# Patient Record
Sex: Male | Born: 1975 | Race: White | Hispanic: No | Marital: Married | Smoking: Current every day smoker
Health system: Southern US, Community
[De-identification: ages and names within clinical notes are randomized; demographics above are authoritative.]

---

## 2015-12-25 ENCOUNTER — Encounter (HOSPITAL_COMMUNITY): Payer: Self-pay | Admitting: *Deleted

## 2015-12-25 ENCOUNTER — Emergency Department (HOSPITAL_COMMUNITY)
Admission: EM | Admit: 2015-12-25 | Discharge: 2015-12-25 | Disposition: A | Discharge status: Home / Self Care | Payer: Self-pay | Attending: Emergency Medicine | Admitting: Emergency Medicine

## 2015-12-25 DIAGNOSIS — M79605 Pain in left leg: Secondary | ICD-10-CM | POA: Insufficient documentation

## 2015-12-25 DIAGNOSIS — M5442 Lumbago with sciatica, left side: Secondary | ICD-10-CM

## 2015-12-25 DIAGNOSIS — F17200 Nicotine dependence, unspecified, uncomplicated: Secondary | ICD-10-CM | POA: Insufficient documentation

## 2015-12-25 DIAGNOSIS — M5440 Lumbago with sciatica, unspecified side: Secondary | ICD-10-CM | POA: Insufficient documentation

## 2015-12-25 DIAGNOSIS — M5441 Lumbago with sciatica, right side: Secondary | ICD-10-CM

## 2015-12-25 DIAGNOSIS — M79604 Pain in right leg: Secondary | ICD-10-CM | POA: Insufficient documentation

## 2015-12-25 MED ORDER — KETOROLAC TROMETHAMINE 60 MG/2ML IM SOLN
60.0000 mg | Freq: Once | INTRAMUSCULAR | Status: AC
  Administered 2015-12-25: 60 mg via INTRAMUSCULAR
  Filled 2015-12-25: qty 2

## 2015-12-25 MED ORDER — DEXAMETHASONE SODIUM PHOSPHATE 10 MG/ML IJ SOLN
10.0000 mg | Freq: Once | INTRAMUSCULAR | Status: AC
  Administered 2015-12-25: 10 mg via INTRAMUSCULAR
  Filled 2015-12-25: qty 1

## 2015-12-25 MED ORDER — MELOXICAM 15 MG PO TABS: 15.0000 mg | ORAL_TABLET | Freq: Every day | ORAL | Status: AC

## 2015-12-25 NOTE — ED Notes (Signed)
Pt states he hurt himself a few days ago and "really hurt" his back while he was mowing. Pt states his pain is from his lower back bilaterally around to his front on either side. Pt is in NAD upon triage and is wearing sunglasses.

## 2015-12-25 NOTE — ED Notes (Signed)
Pt is in stable condition upon d/c and ambulates from ED. 

## 2015-12-25 NOTE — Discharge Instructions (Signed)
You have been seen today for back pain. Follow-up with orthopedics should symptoms continue. Call the number provided to set up an appointment. Follow up with PCP as needed. Return to ED should symptoms worsen.  RESOURCE GUIDE  Chronic Pain Problems: Contact [facility]  [phone] Patients need to be referred by their primary care doctor.  Insufficient Money for Medicine: Contact [employer] Way:  call "211" or [facility] [phone].  No Primary Care Doctor: - Call Health Connect  [phone] - can help you locate a primary care doctor that  accepts your insurance, provides certain services, etc. - Physician Referral Service- [phone]  Agencies that provide inexpensive medical care: - [facility]  [phone] - [facility]  [phone] - [facility]  [phone] - Women's Clinic  [phone] - Planned Parenthood  [phone] [facility]  [phone]  Medicaid-accepting [facility] Providers: - [facility]- [address], [address]  [phone], Mon-Fri 9am-7pm, Sat 9am-1pm - [facility]- [address], [address] [place]  [phone] - [facility]- [address], [address] [place]  [phone] [facility]- [address] [place] [address]  [phone] - Renaye RakersVeita Bland- [address], [address], [phone]  Only accepts [place] patients after they have their name  applied to their card  Self Pay (no insurance) in [place]: - Sickle Cell Patients: Dr Willey BladeEric Dean, [facility]  [address], [phone] - [facility]- [address]  [phone]       Redge Gainer-     [facility]- [address], [address]       -     [facility]- see information above (Speak to Cit[employer]Pam H if you do not have insurance)       -  [facility]- [address], [phone]       -  [facility]  [facility]- [address],  [phone]       -  [facility]- [address] [place] [address], [phone]       -  Dr Julio Sickssei-Bonsu-  [address], [address], [place], [phone]       -  [facility]- [address], [phone]       -  [facility] - [address] [place] [address], [phone], also [address], [phone]       -    [facility]- [address], [phone], 1st & 3rd Saturday   every month, 10am-1pm  1) Find a Doctor and Pay Out of Pocket Although you won't have to find out who is covered by your insurance plan, it is a good idea to ask around and get recommendations. You will then need to call the office and see if the doctor you have chosen will accept you as a new patient and what types of options they offer for patients who are self-pay. Some doctors offer discounts or will set up payment plans for their patients who do not have insurance, but you will need to ask so you aren't surprised when you get to your appointment.  2) Contact Your Local Health Department Not all health departments have doctors that can see patients for sick visits, but many do, so it is worth a call to see if yours does. If  you don't know where your local health department is, you can check in your phone book. The CDC also has a tool to help you locate your state's health department, and many state websites also have listings of all of their local health departments.  3) Find a [facility] If your illness is not likely to be very severe or complicated, you may want to try a walk in clinic. These are popping up all over the country in pharmacies, drugstores, and shopping centers. They're usually staffed by nurse practitioners or physician assistants that have been trained to treat common illnesses and complaints. They're usually fairly quick and inexpensive. However, if you have serious medical issues or chronic medical problems, these are probably not your best option  STD  Testing - [place], [facility], [address] [place], phone [phone] or [phone].  Monday - Friday, call for an appointment. - [place], [facility], [address], [place], phone [phone] or [phone].  Monday - Friday, call for an appointment.  Abuse/Neglect: - [place] [phone] - [place] [phone] (After Hours)  Emergency Shelter:  [facility] [phone]  Maternity Homes: - Room at the [place] [phone] - [place] [phone]  MRSA Hotline #:   2696065598  [facility]. [address]         [place] Phone:  [phone]                                  Phone:  [phone]                   Phone:  [phone]  [place], [place] in [place], [address]                                  [place] [phone] or [phone] (After Hours)   [place]  Substance Abuse Resources: - [facility] and Drug Services  [phone] - [place] [phone] - [facility] [phone] - Residential & Outpatient Substance Abuse Program  [phone]  Psychological Services: - [place]  [address]  [place], [phone] [place]. [address] [place], [place]: [phone] or [phone],  Pic[employer].uy  Dental Assistance  If unable to pay or uninsured, contact:  [facility] or [facility]. to become qualified for the adult dental clinic.  Patients with Medicaid: [facility] [phone] [address], [place] [address] [phone]  If unable to pay, or uninsured, contact [facility] [phone]) or [place] [phone] in [place], [place] in [facility]) to become qualified for the adult dental clinic   Other [place]- [place], [place], [place], [address], [place], [place], 2nd and 4th Thursday of the month at 6:30am.  10 clients each day by appointment, can sometimes see walk-in patients if someone does not show for an appointment. [facility]- [address] Hillard D[place]er [place], [place], [address], [place], [place], [place], [address], [place] Department- [place] Department- [place] Department- [phone]

## 2015-12-25 NOTE — ED Provider Notes (Signed)
CSN: [id number]     Arrival date & time 12/25/15  1248 History  By signing my name below, I, Linna Darner, attest that this documentation has been prepared under the direction and in the presence of non-physician practitioner, Harolyn Rutherford, PA-C. Electronically Signed: Linna Darner, Scribe. 12/25/2015. 1:03 PM.    Chief Complaint  Patient presents with  . Back Pain    The history is provided by the patient. No language interpreter was used.     HPI Comments: Andrew Jimenez is a 40 y.o. male with no pertinent PMHx who presents to the Emergency Department complaining of sudden onset, constant, severe, bilateral lower back pain for the last week. Pt notes that he routinely and frequently lifts and moves heavy objects at work and believes this is the cause of his lower back pain. Patient adds that his lifting and moving has been much more than usual during the past 2 weeks. Patient states that his current pain is consistent with previous pain he has experienced. He notes that his lower back pain radiates down his bilateral legs. Pt reports that any movement, twisting, or turning exacerbates his lower back pain severely. He endorses difficulty sleeping last night due to pain. Pt has been applying Salon-Pas (lidocaine) patches and using Advil with no relief; he is in a drug recovery program so he does not use narcotics for pain. He has no h/o cancer, HIV, or IV drug use. Pt has no known allergies. He further denies recent falls/trauma, fever, dysuria, bowel/bladder dysfunction, or any other associated symptoms. He has never seen an orthopedic surgeon for his issues.  History reviewed. No pertinent past medical history. History reviewed. No pertinent past surgical history. History reviewed. No pertinent family history. Social History  Substance Use Topics  . Smoking status: Current Every Day Smoker  . Smokeless tobacco: None  . Alcohol Use: Yes    Review of Systems  Constitutional: Negative for  fever and chills.  Gastrointestinal: Negative for nausea, vomiting and abdominal pain.  Genitourinary: Negative for dysuria and difficulty urinating.  Musculoskeletal: Positive for back pain (bilateral lower) and arthralgias (bilateral legs).  Skin: Negative for color change and pallor.  Neurological: Negative for weakness and numbness.   Allergies  Review of patient's allergies indicates no known allergies.  Home Medications   Prior to Admission medications   Medication Sig Jimenez Date End Date Taking? Authorizing Provider  meloxicam (MOBIC) 15 MG tablet Take 1 tablet (15 mg total) by mouth daily. 12/25/15   Ukiah Trawick C Tashya Alberty, PA-C   BP 113/84 mmHg  Pulse 95  Temp(Src) 98.2 F (36.8 C) (Oral)  Resp 20  SpO2 96% Physical Exam  Constitutional: He is oriented to person, place, and time. He appears well-developed and well-nourished. No distress.  HENT:  Head: Normocephalic and atraumatic.  Eyes: Conjunctivae are normal.  Cardiovascular: Normal rate and regular rhythm.   Pulmonary/Chest: Effort normal.  Musculoskeletal:  Bilateral lumbar muscular tenderness. Full ROM in all extremities and spine. No paraspinal tenderness.   Neurological: He is alert and oriented to person, place, and time. He has normal reflexes.  No sensory deficits. Strength 5/5 in all extremities. No gait disturbance. Coordination intact.   Skin: Skin is warm and dry. He is not diaphoretic.  Nursing note and vitals reviewed.   ED Course  Procedures (including critical care time)  DIAGNOSTIC STUDIES: Oxygen Saturation is 96% on RA, adequate by my interpretation.    COORDINATION OF CARE: 1:04 PM Discussed treatment plan with pt at bedside  and pt agreed to plan.    MDM   Final diagnoses:  Bilateral low back pain with sciatica, sciatica laterality unspecified    Andrew Jimenez presents with lower back pain that occurred after a particularly difficult week at work lifting and moving things.  Patient's  presentation is consistent with muscle strain. No neuro or functional deficits. No red flag symptoms. Patient to follow up with the PCP or orthopedics should symptoms continue. Home care and return precautions discussed. Patient voiced understanding of these instructions and is comfortable with discharge.  I personally performed the services described in this documentation, which was scribed in my presence. The recorded information has been reviewed and is accurate.     Anselm PancoastShawn C Hollin Crewe, PA-C 12/26/15 0753  Loren Raceravid Yelverton, MD 12/29/15 [id number]

## 2016-01-20 ENCOUNTER — Emergency Department (HOSPITAL_COMMUNITY)
Admission: EM | Admit: 2016-01-20 | Discharge: 2016-01-20 | Disposition: A | Discharge status: Home / Self Care | Payer: Worker's Compensation | Attending: Emergency Medicine | Admitting: Emergency Medicine

## 2016-01-20 ENCOUNTER — Encounter (HOSPITAL_COMMUNITY): Payer: Self-pay | Admitting: Emergency Medicine

## 2016-01-20 ENCOUNTER — Emergency Department (HOSPITAL_COMMUNITY): Payer: Worker's Compensation

## 2016-01-20 DIAGNOSIS — Z791 Long term (current) use of non-steroidal anti-inflammatories (NSAID): Secondary | ICD-10-CM | POA: Insufficient documentation

## 2016-01-20 DIAGNOSIS — F17200 Nicotine dependence, unspecified, uncomplicated: Secondary | ICD-10-CM | POA: Insufficient documentation

## 2016-01-20 DIAGNOSIS — M545 Low back pain: Secondary | ICD-10-CM | POA: Insufficient documentation

## 2016-01-20 MED ORDER — HYDROCODONE-ACETAMINOPHEN 5-325 MG PO TABS
1.0000 | ORAL_TABLET | Freq: Once | ORAL | Status: AC
  Administered 2016-01-20: 1 via ORAL
  Filled 2016-01-20: qty 1

## 2016-01-20 MED ORDER — TRAMADOL HCL 50 MG PO TABS: 50.0000 mg | ORAL_TABLET | Freq: Four times a day (QID) | ORAL | Status: AC | PRN

## 2016-01-20 MED ORDER — METHOCARBAMOL 500 MG PO TABS: 500.0000 mg | ORAL_TABLET | Freq: Two times a day (BID) | ORAL | Status: AC

## 2016-01-20 MED ORDER — METHOCARBAMOL 500 MG PO TABS
750.0000 mg | ORAL_TABLET | Freq: Once | ORAL | Status: AC
  Administered 2016-01-20: 750 mg via ORAL
  Filled 2016-01-20: qty 2

## 2016-01-20 NOTE — Discharge Instructions (Signed)
Take your medications as prescribed. I also recommend continuing to apply ice and/or heat to affected area for 15-20 minutes 3-4 times daily. Refrain from doing any heavy lifting or exacerbating/repetitive movements that worsen your pain for the next few days. You may continue taking 600mg  Ibuprofen 4 times daily in addition for pain relief. Please follow up with a primary care provider from the Resource Guide provided below in 5-7 days if your pain has not improved. Please return to the Emergency Department if symptoms worsen or new onset of fever, numbness, tingling, groin numbness, abdominal pain, urinary retention, loss of bowel or bladder, weakness.

## 2016-01-20 NOTE — ED Provider Notes (Signed)
History  By signing my name below, I, Earmon PhoenixJennifer Waddell, attest that this documentation has been prepared under the direction and in the presence of Melburn HakeNicole Nadeau, [place]-C. Electronically Signed: Earmon PhoenixJennifer Waddell, ED Scribe. 01/20/2016. 12:17 PM.  Chief Complaint  Patient presents with  . Back Pain   The history is provided by the patient and medical records. No language interpreter was used.    HPI Comments:  Andrew Jimenez is a 40 y.o. obese male who presents to the Emergency Department complaining of worsening lower back pain that began about three weeks ago. He states he was seen here for the injury about three weeks ago but received no imaging and was prescribed Mobic. Pt states he initially injured his back by pushing a large machine. He states the pain began getting better but started worsening last week. He reports the pain is across the lower back, right side greater than left. He states the pain radiates down the back of his legs. He has been applying OTC Salonpas patches with minimal relief of the pain. He has been taking the Mobic but reports it causes nausea. Walking, bending and movement increases the pain. He denies alleviating factors. He denies fever, chills, nausea, vomiting, abdominal pain, difficulty urinating, saddle anesthesia, bowel or bladder incontinence, numbness or tingling of the lower extremities, bruising or wounds. He denies any new trauma, injury or fall. He denies h/o cancer or IV drug use. He denies any chiropractic or acupuncture care. Pt is ambulatory without difficulty or assistance.  History reviewed. No pertinent past medical history. History reviewed. No pertinent past surgical history. No family history on file. Social History  Substance Use Topics  . Smoking status: Current Every Day Smoker  . Smokeless tobacco: None  . Alcohol Use: Yes    Review of Systems  Constitutional: Negative for fever and chills.  Gastrointestinal: Negative for nausea,  vomiting and abdominal pain.  Genitourinary:       No bowel or bladder incontinence  Musculoskeletal: Positive for back pain.  Skin: Negative for color change and wound.  Neurological: Negative for weakness and numbness.    Allergies  Review of patient's allergies indicates no known allergies.  Home Medications   Prior to Admission medications   Medication Sig Start Date End Date Taking? Authorizing Provider  meloxicam (MOBIC) 15 MG tablet Take 1 tablet (15 mg total) by mouth daily. 12/25/15   Shawn C Joy, PA-C  methocarbamol (ROBAXIN) 500 MG tablet Take 1 tablet (500 mg total) by mouth 2 (two) times daily. 01/20/16   Barrett HenleNicole Elizabeth Nadeau, PA-C  traMADol (ULTRAM) 50 MG tablet Take 1 tablet (50 mg total) by mouth every 6 (six) hours as needed. 01/20/16   Barrett HenleNicole Elizabeth Nadeau, PA-C   Triage Vitals: BP 118/84 mmHg  Pulse 70  Temp(Src) 98.5 F (36.9 C) (Oral)  Resp 18  Ht 5\' 9"  (1.753 m)  Wt 220 lb (99.791 kg)  BMI 32.47 kg/m2  SpO2 96% Physical Exam  Constitutional: He is oriented to person, place, and time. He appears well-developed and well-nourished.  HENT:  Head: Normocephalic and atraumatic.  Eyes: EOM are normal.  Neck: Normal range of motion.  Cardiovascular: Normal rate, regular rhythm and normal heart sounds.  Exam reveals no gallop and no friction rub.   No murmur heard. Pulmonary/Chest: Effort normal and breath sounds normal. No respiratory distress. He has no wheezes. He has no rales.  Abdominal: Soft. There is no tenderness.  Musculoskeletal: Normal range of motion.  No midline C, T  tenderness. Midline lumbar spine tenderness. Tenderness to palpation over bilateral lumbar paraspinal muscles. Full range of motion of neck and back. Full range of motion of bilateral upper and lower extremities, with 5/5 strength. Sensation intact. 2+ radial and PT pulses. Cap refill <2 seconds. Patient able to stand and ambulate without assistance.   Neurological: He is alert and  oriented to person, place, and time. He has normal reflexes.  Skin: Skin is warm and dry.  Psychiatric: He has a normal mood and affect. His behavior is normal.  Nursing note and vitals reviewed.   ED Course  Procedures (including critical care time) DIAGNOSTIC STUDIES: Oxygen Saturation is 96% on RA, normal by my interpretation.   COORDINATION OF CARE: 12:12 PM- Will order L-spine X-Ray. Will order pain medication and muscle relaxer. Pt verbalizes understanding and agrees to plan.  Medications  methocarbamol (ROBAXIN) tablet 750 mg (750 mg Oral Given 01/20/16 1230)  HYDROcodone-acetaminophen (NORCO/VICODIN) 5-325 MG per tablet 1 tablet (1 tablet Oral Given 01/20/16 1230)    Labs Review Labs Reviewed - No data to display  Imaging Review Dg Lumbar Spine Complete  01/20/2016  CLINICAL DATA:  Chronic low back pain with BILATERAL hip pain worse this week EXAM: LUMBAR SPINE - COMPLETE 4+ VIEW COMPARISON:  None FINDINGS: 5 non-rib-bearing lumbar vertebra. Vertebral body and disc space heights maintained. Endplate spur formation at L2-L3 through L4-L5 as well as at T11-T12. No fracture, subluxation or bone destruction. Minimal dextro convex thoracolumbar scoliosis. No spondylolysis. SI joints symmetric. Scattered atherosclerotic calcifications. IMPRESSION: Mild degenerative changes lumbar spine. No acute abnormalities. Electronically Signed   By: Ulyses Southward M.D.   On: 01/20/2016 13:12   I have personally reviewed and evaluated these images and lab results as part of my medical decision-making.   EKG Interpretation None      MDM   Final diagnoses:  Low back pain, unspecified back pain laterality, with sciatica presence unspecified    Patient with back pain.  TTP over lumbar spine on exam. No neurological deficits and normal neuro exam.  Patient can walk but states is painful.  No loss of bowel or bladder control.  No concern for cauda equina.  No fever, night sweats, weight loss, h/o  cancer, IVDU.  Pt given pain meds and muscle relaxant in the ED. Lumbar spine xray negative. On revaluation patient reports his pain has improved. Discussed results and plan for discharge with patient. Plan to discharge patient home with pain meds, muscle relaxant, RICE protocol. Patient given resource to follow up with PCP outpatient. Strict return precautions with patient.    I personally performed the services described in this documentation, which was scribed in my presence. The recorded information has been reviewed and is accurate.     Satira Sark Blanchard, [place] 01/20/16 1340  Zadie Rhine, MD 01/20/16 [id number]

## 2016-01-20 NOTE — ED Notes (Signed)
Declined W/C at D/C and was escorted to lobby by RN. 

## 2016-01-20 NOTE — ED Notes (Signed)
Pt reports lower back pain from an injury at work three weeks ago. Pt reports he was seen for it but continues to have worsening lower back pain. Pt alert x4.

## 2017-11-08 IMAGING — DX DG LUMBAR SPINE COMPLETE 4+V
5 series · 5 of 5 positions shown · non-contrast
Comparison: None

CLINICAL DATA: Chronic low back pain with BILATERAL hip pain worse
this week

EXAM:
LUMBAR SPINE - COMPLETE 4+ VIEW

[t lumbar spine ap]
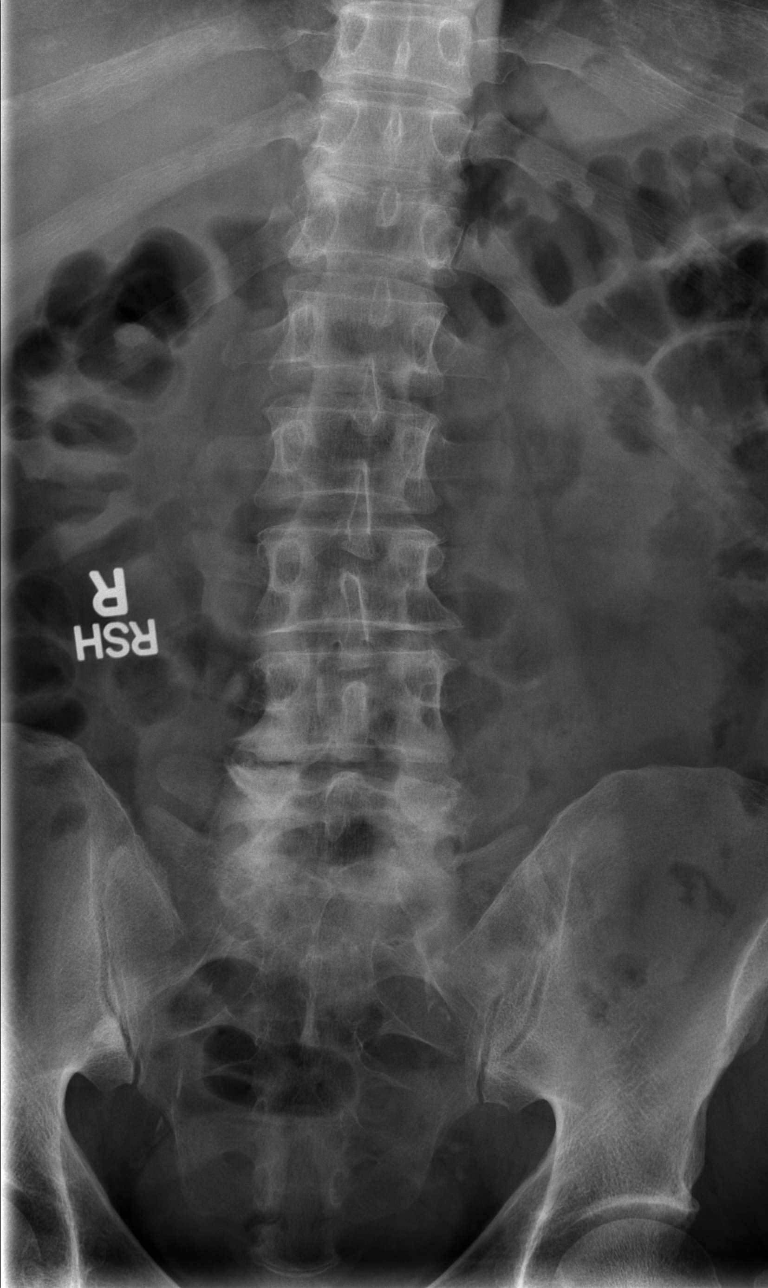

[t lumbar spine obl (1 of 2)]
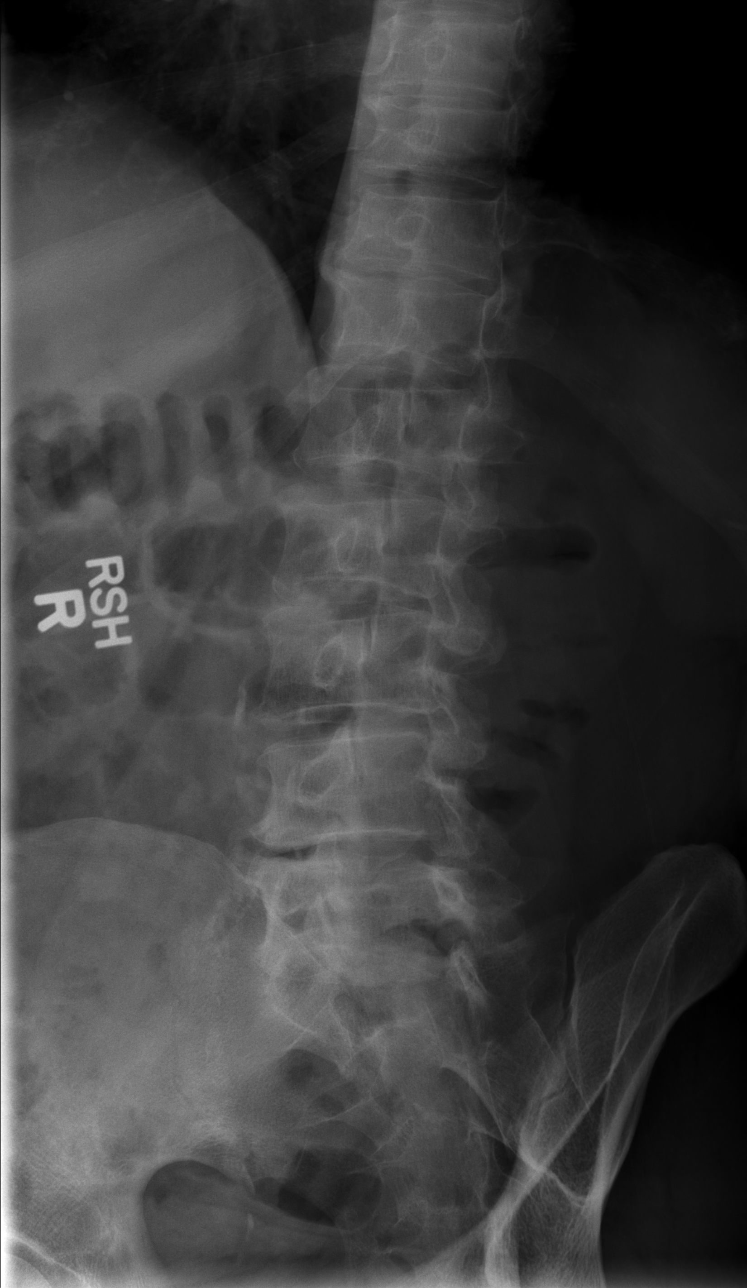

[t lumbar spine obl (2 of 2)]
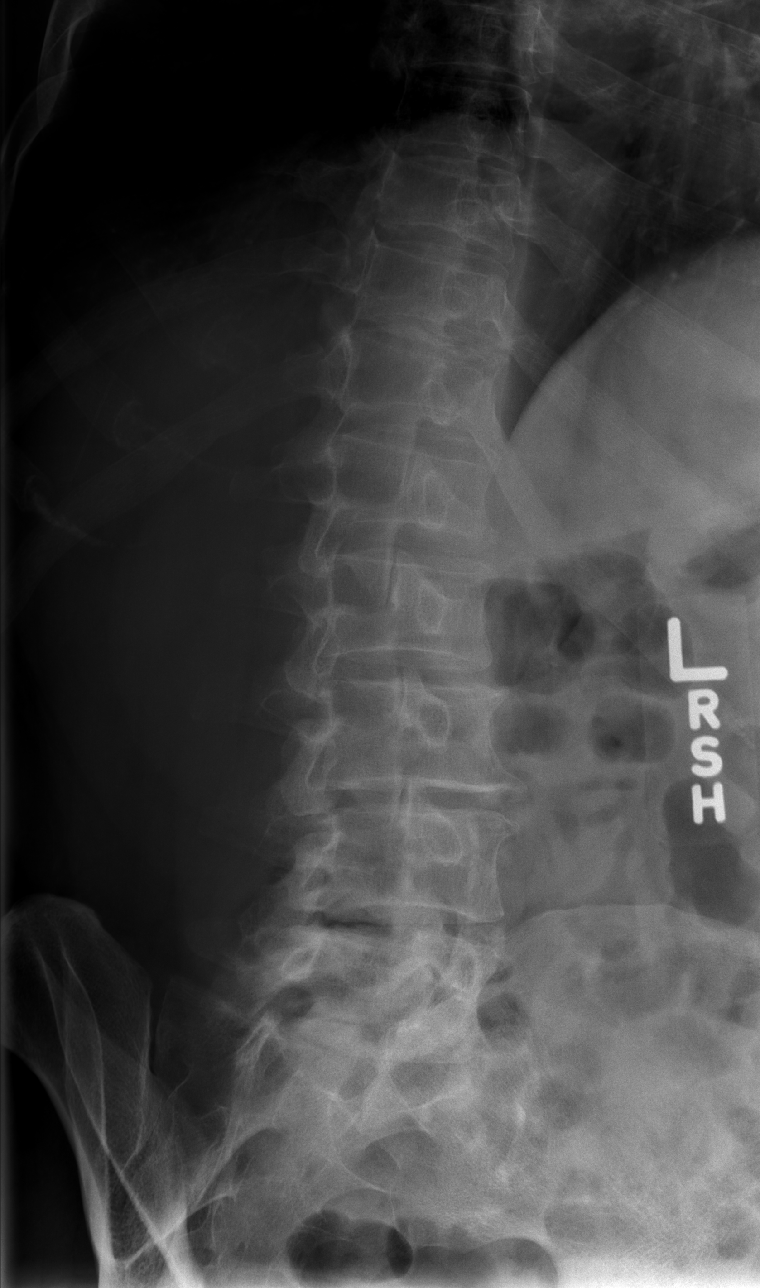

[t lumbar spine lat]
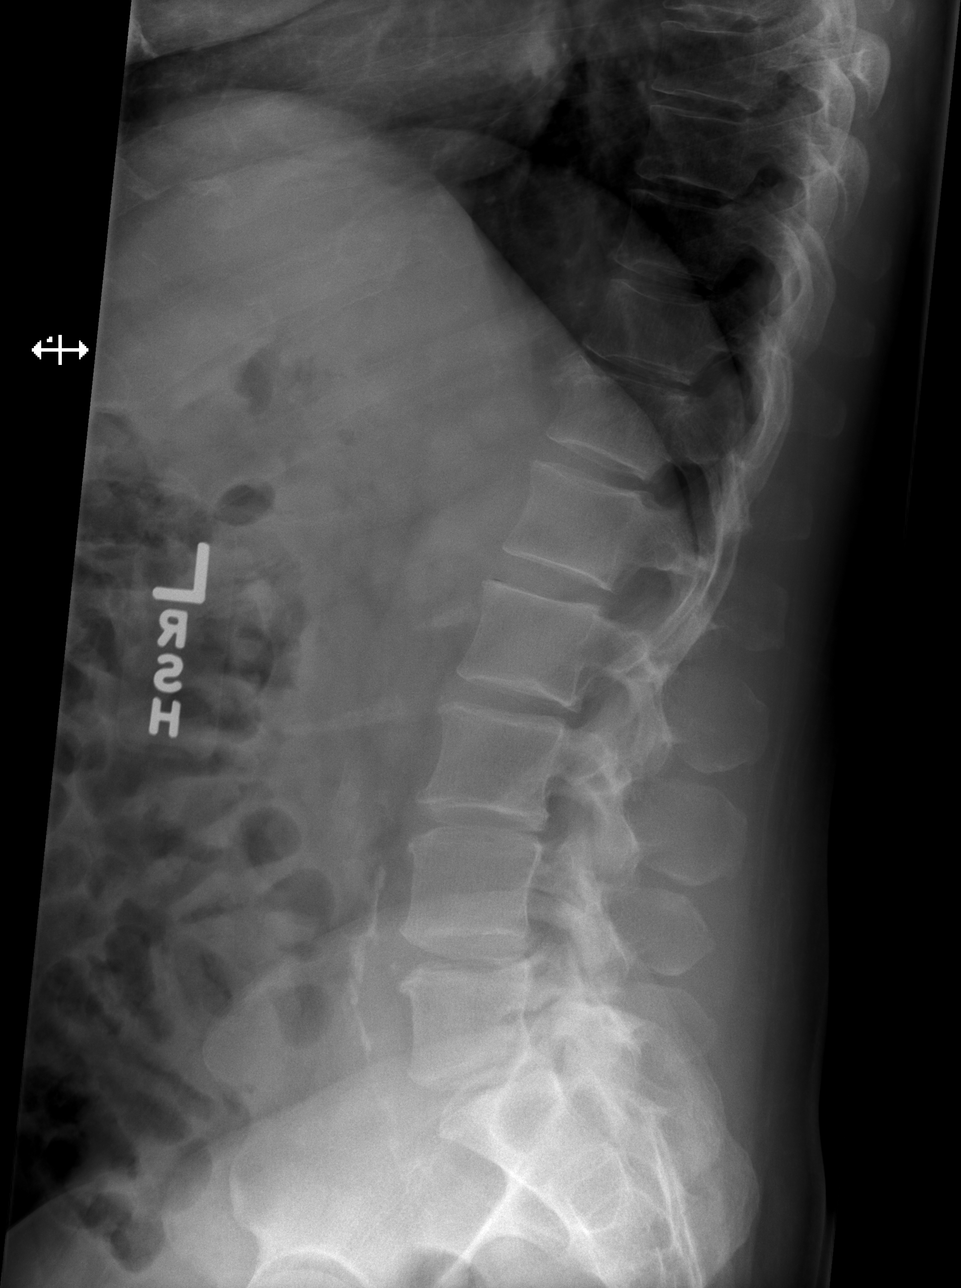

[t lumbar l-5 s-1 spot]
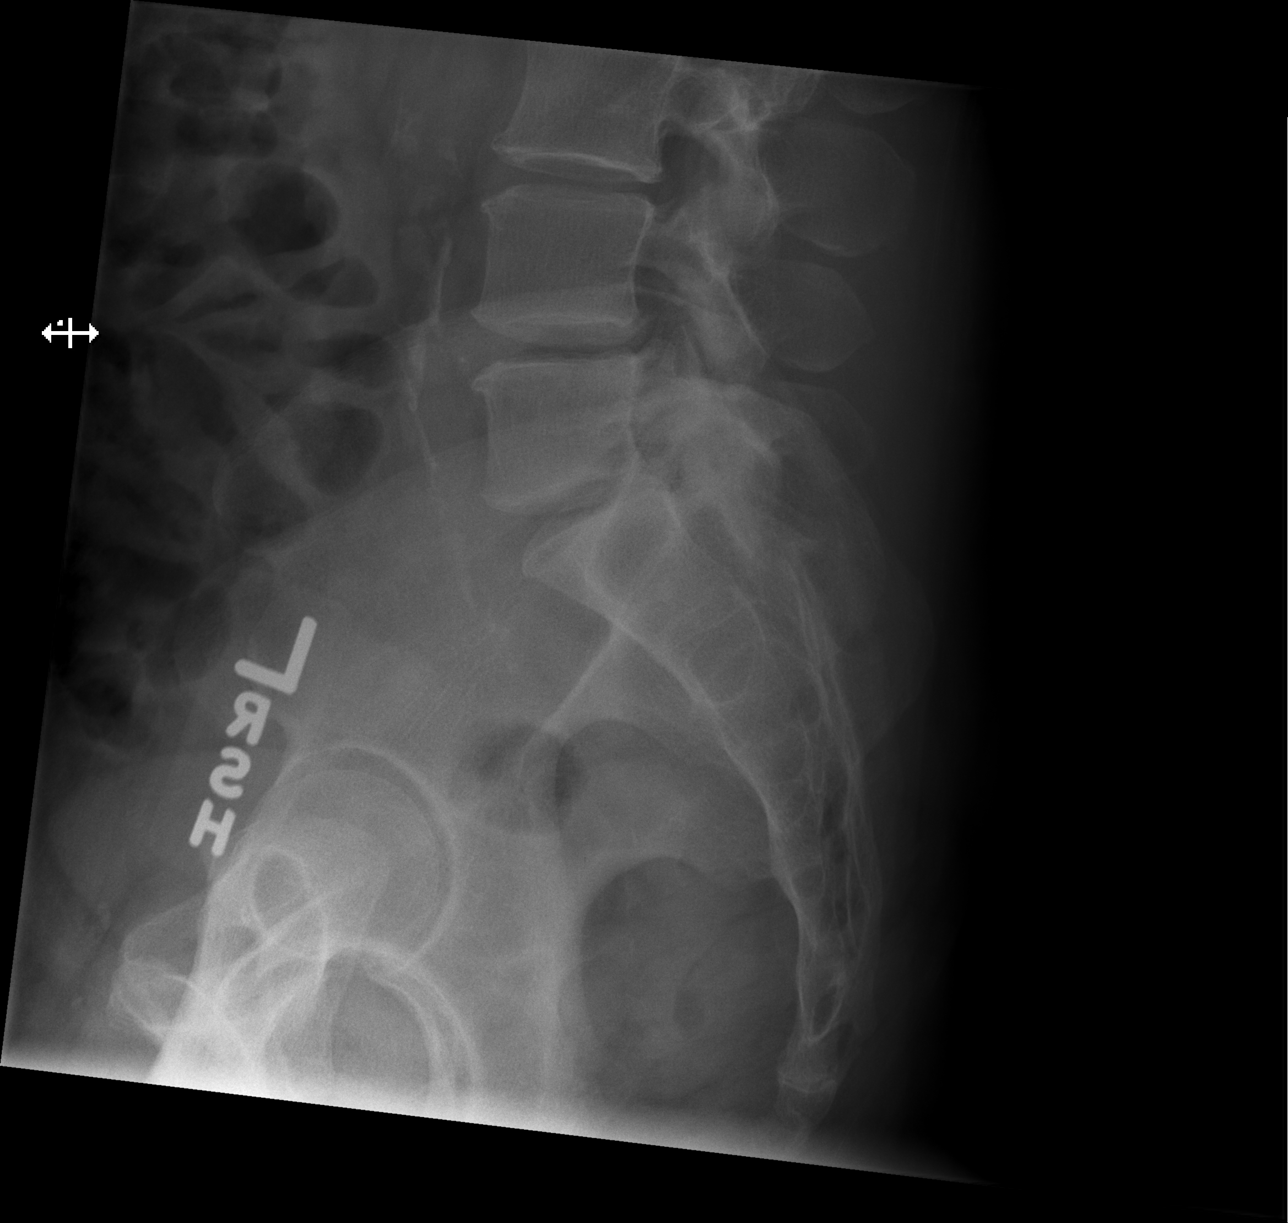

[5 of 5 positions shown; findings below may reference images not displayed]

FINDINGS: 5 non-rib-bearing lumbar vertebra.

Vertebral body and disc space heights maintained.

Endplate spur formation at L2-L3 through L4-L5 as well as at
T11-T12.

No fracture, subluxation or bone destruction.

Minimal dextro convex thoracolumbar scoliosis.

No spondylolysis.

SI joints symmetric.

Scattered atherosclerotic calcifications.
IMPRESSION: Mild degenerative changes lumbar spine.

No acute abnormalities.
